# Patient Record
Sex: Female | Born: 1975 | Race: Black or African American | Hispanic: No | Marital: Single | Smoking: Current some day smoker
Health system: Southern US, Community
[De-identification: ages and names within clinical notes are randomized; demographics above are authoritative.]

## PROBLEM LIST (undated history)

## (undated) ENCOUNTER — Emergency Department: Admission: EM | Payer: No Typology Code available for payment source | Source: Home / Self Care

## (undated) DIAGNOSIS — I10 Essential (primary) hypertension: Secondary | ICD-10-CM

---

## 2004-05-20 ENCOUNTER — Emergency Department: Payer: Self-pay | Admitting: Emergency Medicine

## 2004-07-15 ENCOUNTER — Emergency Department: Payer: Self-pay | Admitting: Emergency Medicine

## 2004-09-02 ENCOUNTER — Emergency Department: Payer: Self-pay | Admitting: General Practice

## 2005-04-07 ENCOUNTER — Ambulatory Visit: Payer: Self-pay

## 2005-04-08 ENCOUNTER — Ambulatory Visit: Payer: Self-pay | Admitting: Obstetrics and Gynecology

## 2005-04-11 ENCOUNTER — Ambulatory Visit: Payer: Self-pay

## 2005-06-19 ENCOUNTER — Observation Stay: Payer: Self-pay | Admitting: Obstetrics & Gynecology

## 2005-07-16 ENCOUNTER — Observation Stay: Payer: Self-pay

## 2005-07-26 ENCOUNTER — Observation Stay: Payer: Self-pay | Admitting: Unknown Physician Specialty

## 2005-08-26 ENCOUNTER — Inpatient Hospital Stay: Payer: Self-pay

## 2005-10-08 ENCOUNTER — Emergency Department: Payer: Self-pay | Admitting: Emergency Medicine

## 2005-10-08 ENCOUNTER — Other Ambulatory Visit: Payer: Self-pay

## 2007-02-11 ENCOUNTER — Emergency Department: Payer: Self-pay | Admitting: Emergency Medicine

## 2007-02-11 ENCOUNTER — Other Ambulatory Visit: Payer: Self-pay

## 2007-09-04 ENCOUNTER — Emergency Department: Payer: Self-pay | Admitting: Emergency Medicine

## 2007-10-19 ENCOUNTER — Emergency Department: Payer: Self-pay | Admitting: Unknown Physician Specialty

## 2008-06-01 ENCOUNTER — Ambulatory Visit: Payer: Self-pay

## 2011-05-14 ENCOUNTER — Emergency Department: Payer: Self-pay | Admitting: Internal Medicine

## 2011-09-21 ENCOUNTER — Emergency Department: Payer: Self-pay | Admitting: *Deleted

## 2011-09-21 LAB — COMPREHENSIVE METABOLIC PANEL
Albumin: 4 g/dL (ref 3.4–5.0)
Alkaline Phosphatase: 41 U/L — ABNORMAL LOW (ref 50–136)
BUN: 11 mg/dL (ref 7–18)
Creatinine: 0.7 mg/dL (ref 0.60–1.30)
EGFR (African American): >60
EGFR (Non-African Amer.): >60
Glucose: 99 mg/dL (ref 65–99)
Osmolality: 275 (ref 275–301)
Potassium: 3.8 mmol/L (ref 3.5–5.1)
SGOT(AST): 20 U/L (ref 15–37)
Sodium: 138 mmol/L (ref 136–145)
Total Protein: 7.2 g/dL (ref 6.4–8.2)

## 2011-09-21 LAB — CK TOTAL AND CKMB (NOT AT ARMC): CK-MB: 0.6 ng/mL (ref 0.5–3.6)

## 2011-09-21 LAB — CBC
HGB: 14.2 g/dL (ref 12.0–16.0)
MCV: 94 fL (ref 80–100)
RBC: 4.63 10*6/uL (ref 3.80–5.20)
RDW: 13.1 % (ref 11.5–14.5)

## 2011-09-21 LAB — PREGNANCY, URINE: Pregnancy Test, Urine: NEGATIVE m[IU]/mL

## 2012-06-26 ENCOUNTER — Emergency Department: Payer: Self-pay | Admitting: Emergency Medicine

## 2012-07-11 ENCOUNTER — Emergency Department: Payer: Self-pay | Admitting: Emergency Medicine

## 2012-08-13 ENCOUNTER — Emergency Department: Payer: Self-pay | Admitting: Emergency Medicine

## 2013-04-29 ENCOUNTER — Emergency Department: Payer: Self-pay | Admitting: Emergency Medicine

## 2013-10-22 ENCOUNTER — Emergency Department: Payer: Self-pay | Admitting: Emergency Medicine

## 2013-11-03 ENCOUNTER — Emergency Department: Payer: Self-pay | Admitting: Emergency Medicine

## 2013-11-14 ENCOUNTER — Emergency Department: Payer: Self-pay | Admitting: Emergency Medicine

## 2013-11-15 LAB — HCG, QUANTITATIVE, PREGNANCY: Beta Hcg, Quant.: 876 m[IU]/mL — ABNORMAL HIGH

## 2013-11-15 LAB — CBC
HCT: 40.9 % (ref 35.0–47.0)
HGB: 13.2 g/dL (ref 12.0–16.0)
MCH: 31.2 pg (ref 26.0–34.0)
MCHC: 32.3 g/dL (ref 32.0–36.0)
MCV: 97 fL (ref 80–100)
Platelet: 268 10*3/uL (ref 150–440)
RBC: 4.23 10*6/uL (ref 3.80–5.20)
RDW: 13.6 % (ref 11.5–14.5)
WBC: 11.4 10*3/uL — ABNORMAL HIGH (ref 3.6–11.0)

## 2013-11-17 ENCOUNTER — Ambulatory Visit: Payer: Self-pay | Admitting: Family Medicine

## 2014-10-05 ENCOUNTER — Emergency Department
Admit: 2014-10-05 | Disposition: A | Discharge status: Home / Self Care | Payer: Self-pay | Admitting: Emergency Medicine

## 2014-10-05 LAB — URINALYSIS, COMPLETE
BILIRUBIN, UR: NEGATIVE
Blood: NEGATIVE
GLUCOSE, UR: NEGATIVE mg/dL (ref 0–75)
KETONE: NEGATIVE
Nitrite: NEGATIVE
Ph: 8 (ref 4.5–8.0)
Protein: 30
Specific Gravity: 1.02 (ref 1.003–1.030)

## 2015-03-24 ENCOUNTER — Encounter: Payer: Self-pay | Admitting: Emergency Medicine

## 2015-03-24 ENCOUNTER — Emergency Department: Payer: No Typology Code available for payment source

## 2015-03-24 ENCOUNTER — Emergency Department
Admission: EM | Admit: 2015-03-24 | Discharge: 2015-03-24 | Disposition: A | Discharge status: Home / Self Care | Payer: No Typology Code available for payment source | Attending: Emergency Medicine | Admitting: Emergency Medicine

## 2015-03-24 DIAGNOSIS — Z720 Tobacco use: Secondary | ICD-10-CM | POA: Diagnosis not present

## 2015-03-24 DIAGNOSIS — S3992XA Unspecified injury of lower back, initial encounter: Secondary | ICD-10-CM | POA: Insufficient documentation

## 2015-03-24 DIAGNOSIS — Z79899 Other long term (current) drug therapy: Secondary | ICD-10-CM | POA: Insufficient documentation

## 2015-03-24 DIAGNOSIS — M545 Low back pain, unspecified: Secondary | ICD-10-CM

## 2015-03-24 DIAGNOSIS — Y998 Other external cause status: Secondary | ICD-10-CM | POA: Diagnosis not present

## 2015-03-24 DIAGNOSIS — Y9389 Activity, other specified: Secondary | ICD-10-CM | POA: Insufficient documentation

## 2015-03-24 DIAGNOSIS — Z3202 Encounter for pregnancy test, result negative: Secondary | ICD-10-CM | POA: Insufficient documentation

## 2015-03-24 DIAGNOSIS — I10 Essential (primary) hypertension: Secondary | ICD-10-CM | POA: Insufficient documentation

## 2015-03-24 DIAGNOSIS — Y92410 Unspecified street and highway as the place of occurrence of the external cause: Secondary | ICD-10-CM | POA: Diagnosis not present

## 2015-03-24 HISTORY — DX: Essential (primary) hypertension: I10

## 2015-03-24 LAB — POCT PREGNANCY, URINE: PREG TEST UR: NEGATIVE

## 2015-03-24 MED ORDER — ETODOLAC 400 MG PO TABS: 400.0000 mg | ORAL_TABLET | Freq: Two times a day (BID) | ORAL | Status: DC

## 2015-03-24 MED ORDER — METHOCARBAMOL 500 MG PO TABS
500.0000 mg | ORAL_TABLET | Freq: Four times a day (QID) | ORAL | Status: DC
Start: 2015-03-24 — End: 2021-11-28

## 2015-03-24 MED ORDER — KETOROLAC TROMETHAMINE 60 MG/2ML IM SOLN
60.0000 mg | Freq: Once | INTRAMUSCULAR | Status: AC
  Administered 2015-03-24: 60 mg via INTRAMUSCULAR
  Filled 2015-03-24: qty 2
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
## 2015-06-24 ENCOUNTER — Emergency Department
Admission: EM | Admit: 2015-06-24 | Discharge: 2015-06-24 | Disposition: A | Discharge status: Home / Self Care | Payer: Medicaid Other | Attending: Emergency Medicine | Admitting: Emergency Medicine

## 2015-06-24 ENCOUNTER — Encounter: Payer: Self-pay | Admitting: Emergency Medicine

## 2015-06-24 DIAGNOSIS — F17200 Nicotine dependence, unspecified, uncomplicated: Secondary | ICD-10-CM | POA: Diagnosis not present

## 2015-06-24 DIAGNOSIS — Z791 Long term (current) use of non-steroidal anti-inflammatories (NSAID): Secondary | ICD-10-CM | POA: Insufficient documentation

## 2015-06-24 DIAGNOSIS — Z79899 Other long term (current) drug therapy: Secondary | ICD-10-CM | POA: Diagnosis not present

## 2015-06-24 DIAGNOSIS — Z3202 Encounter for pregnancy test, result negative: Secondary | ICD-10-CM | POA: Insufficient documentation

## 2015-06-24 DIAGNOSIS — E349 Endocrine disorder, unspecified: Secondary | ICD-10-CM

## 2015-06-24 DIAGNOSIS — R7989 Other specified abnormal findings of blood chemistry: Secondary | ICD-10-CM | POA: Diagnosis not present

## 2015-06-24 DIAGNOSIS — I10 Essential (primary) hypertension: Secondary | ICD-10-CM | POA: Insufficient documentation

## 2015-06-24 LAB — URINALYSIS COMPLETE WITH MICROSCOPIC (ARMC ONLY)
BILIRUBIN URINE: NEGATIVE
Bacteria, UA: NONE SEEN
Glucose, UA: NEGATIVE mg/dL
Hgb urine dipstick: NEGATIVE
KETONES UR: NEGATIVE mg/dL
Leukocytes, UA: NEGATIVE
Nitrite: NEGATIVE
Protein, ur: NEGATIVE mg/dL
Specific Gravity, Urine: 1.017 (ref 1.005–1.030)
pH: 6 (ref 5.0–8.0)

## 2015-06-24 LAB — POCT PREGNANCY, URINE: Preg Test, Ur: NEGATIVE

## 2015-06-24 LAB — HCG, QUANTITATIVE, PREGNANCY: hCG, Beta Chain, Quant, S: 112 m[IU]/mL — ABNORMAL HIGH (ref <5)

## 2015-06-24 MED ORDER — LISINOPRIL 20 MG PO TABS
20.0000 mg | ORAL_TABLET | Freq: Once | ORAL | Status: AC
  Administered 2015-06-24: 20 mg via ORAL
  Filled 2015-06-24: qty 1

## 2015-06-24 MED ORDER — HYDROCHLOROTHIAZIDE 25 MG PO TABS
25.0000 mg | ORAL_TABLET | Freq: Every day | ORAL | Status: DC
  Administered 2015-06-24: 25 mg via ORAL
  Filled 2015-06-24: qty 1

## 2015-06-24 NOTE — Discharge Instructions (Signed)
Continue to take your usual blood pressure medication as instructed. We have given you your dose for this evening now. You do not need to take tonight. Follow-up with [facility] for repeat testing of your hCG level. Today, the level was 112. If he continues to go down, there is likely a little else to do. If the hCG level remains the same or goes up, you may need further evaluation and assistance from the gynecologist.  Hypertension Hypertension is another name for high blood pressure. High blood pressure forces your heart to work harder to pump blood. A blood pressure reading has two numbers, which includes a higher number over a lower number (example: 110/72). HOME CARE   Have your blood pressure rechecked by your doctor.  Only take medicine as told by your doctor. Follow the directions carefully. The medicine does not work as well if you skip doses. Skipping doses also puts you at risk for problems.  Do not smoke.  Monitor your blood pressure at home as told by your doctor. GET HELP IF:  You think you are having a reaction to the medicine you are taking.  You have repeat headaches or feel dizzy.  You have puffiness (swelling) in your ankles.  You have trouble with your vision. GET HELP RIGHT AWAY IF:   You get a very bad headache and are confused.  You feel weak, numb, or faint.  You get chest or belly (abdominal) pain.  You throw up (vomit).  You cannot breathe very well. MAKE SURE YOU:   Understand these instructions.  Will watch your condition.  Will get help right away if you are not doing well or get worse.   This information is not intended to replace advice given to you by your health care provider. Make sure you discuss any questions you have with your health care provider.   Document Released: 11/14/2007 Document Revised: 06/02/2013 Document Reviewed: 03/20/2013 Elsevier Interactive Patient Education [employer]2016 [employer] Inc.

## 2015-06-24 NOTE — ED Notes (Signed)
MD at bedside. 

## 2015-06-24 NOTE — ED Notes (Signed)
Pt sent over from [facility] dept for further eval of having a high blood pressure. Pt recently had a pill induced abortion back on 12/23. Had follow up today with [facility] and pt states they did a urine preg today and was positive.

## 2015-06-24 NOTE — ED Provider Notes (Signed)
[facility] Emergency Department Provider Note  ____________________________________________  Time seen: 1628  I have reviewed the triage vital signs and the nursing notes.  History by:  Patient  HISTORY  Chief Complaint Hypertension  question of pregnancy    HPI Karla Jacobs is a 40 y.o. female who recently took medication to terminate a pregnancy. She was approximately [redacted] weeks along when she took this medication that was prescribed by the health department. She did a prednisone test at home and saw that the pregnancy test was positive. She will back to the health department. They repeated the principal test and noted it was positive as well. They're concerned though was for her hypertension. Apparently her blood pressure was approximately 200/100. The patient tells me she tried to tell them that she had not taken her blood pressure medication last night and it is not surprising that her blood pressure was up. She does have a history of hypertension. She denies any chest pain, shortness of breath, headache, or other symptoms.    Past Medical History  Diagnosis Date  . Hypertension     There are no active problems to display for this patient.   History reviewed. No pertinent past surgical history.  Current Outpatient Rx  Name  Route  Sig  Dispense  Refill  . etodolac (LODINE) 400 MG tablet   Oral   Take 1 tablet (400 mg total) by mouth 2 (two) times daily.   20 tablet   0   . hydrochlorothiazide (HYDRODIURIL) 25 MG tablet   Oral   Take 25 mg by mouth daily.         . methocarbamol (ROBAXIN) 500 MG tablet   Oral   Take 1 tablet (500 mg total) by mouth 4 (four) times daily.   20 tablet   0     Allergies Review of patient's allergies indicates no known allergies.  No family history on file.  Social History Social History  Substance Use Topics  . Smoking status: Current Some Day Smoker  . Smokeless tobacco: None  . Alcohol Use:  Yes     Comment: occassional    Review of Systems  Constitutional: Negative for fever/chills. ENT: Negative for congestion. Cardiovascular: Negative for chest pain. Respiratory: Negative for cough. Gastrointestinal: Negative for abdominal pain, vomiting and diarrhea. Genitourinary: Recent use of an abortafactant. Positive home pregnancy test. See history of present illness. Musculoskeletal: No back pain. Skin: Negative for rash. Neurological: Negative for headache or focal weakness   10-point ROS otherwise negative.  ____________________________________________   PHYSICAL EXAM:  VITAL SIGNS: ED Triage Vitals  Enc Vitals Group     BP 06/24/15 1312 168/100 mmHg     Pulse Rate 06/24/15 1312 87     Resp 06/24/15 1312 18     Temp 06/24/15 1312 98.2 F (36.8 C)     Temp Source 06/24/15 1312 Oral     SpO2 06/24/15 1312 100 %     Weight 06/24/15 1312 211 lb (95.709 kg)     Height 06/24/15 1312 5\' 3"  (1.6 m)     Head Cir --      Peak Flow --      Pain Score 06/24/15 1323 7     Pain Loc --      Pain Edu? --      Excl. in GC? --     Constitutional:  Alert and oriented. Well appearing and in no distress. ENT   Head: Normocephalic and atraumatic.  Nose: No congestion/rhinnorhea.  Cardiovascular: Normal rate, regular rhythm, no murmur noted Respiratory:  Normal respiratory effort, no tachypnea.    Breath sounds are clear and equal bilaterally.  Gastrointestinal: Soft, no distention. Nontender Back: No muscle spasm, no tenderness, no CVA tenderness. Musculoskeletal: No deformity noted. Nontender with normal range of motion in all extremities.  No noted edema. Neurologic:  Communicative. Normal appearing spontaneous movement in all 4 extremities. No gross focal neurologic deficits are appreciated.  Skin:  Skin is warm, dry. No rash noted. Psychiatric: Mood and affect are normal. Speech and behavior are normal.  ____________________________________________    LABS  (pertinent positives/negatives)  Labs Reviewed  URINALYSIS COMPLETEWITH MICROSCOPIC ([facility] ONLY) - Abnormal; Notable for the following:    Color, Urine YELLOW (*)    APPearance CLEAR (*)    Squamous Epithelial / LPF 0-5 (*)    All other components within normal limits  HCG, QUANTITATIVE, PREGNANCY - Abnormal; Notable for the following:    hCG, Beta Chain, Quant, S 112 (*)    All other components within normal limits  POC URINE PREG, ED  POCT PREGNANCY, URINE     ____________________________________________  ____________________________________________   INITIAL IMPRESSION / ASSESSMENT AND PLAN / ED COURSE  Pertinent labs & imaging results that were available during my care of the patient were reviewed by me and considered in my medical decision making (see chart for details).  Well-appearing 40 year old female in no acute distress. She had noted hypertension. Her numbers have improved some. She has a history of hypertension. She has been asymptomatic. We will provide her with her usual home medication and discharge her home.  The patient's hCG level is 112. As we do not have a comparison, we do not know how far down from her peak she has come. Having terminated this pregnancy pharmaceutically at 7 weeks of gestation, her hCG a few weeks ago may have been as high as 20 or 30,000. I have explained this to the patient. She has been very appreciative of the information and understands that she simply needs a repeat hCG level. Overall, I suspect the level of 112 to be normal for her situation. She will follow-up with [facility] for further evaluation.  ____________________________________________   FINAL CLINICAL IMPRESSION(S) / ED DIAGNOSES  Final diagnoses:  Essential hypertension  Elevated serum hCG       Darien Ramus, MD 06/24/15 [phone]

## 2015-06-24 NOTE — ED Notes (Signed)
Went to HD, states her b/p is high, has no dizziness or headache, warm blankets given

## 2017-05-10 ENCOUNTER — Ambulatory Visit
Admission: RE | Admit: 2017-05-10 | Discharge: 2017-05-10 | Disposition: A | Discharge status: Home / Self Care | Attending: Family Medicine | Admitting: Family Medicine

## 2017-05-10 NOTE — ED Notes (Signed)
Patient ambulatory to triage with steady gait, without difficulty or distress noted, in custody of [place] [employer] officer Pride and [place] for forensic blood draw; pt A&Ox3, with no c/o voiced and denies need to see ED provider; pt voices good understanding of blood draw to be performed for forensic testing; pt verifies identity with name and DOB; after 2 unsuccessful blood draw attempts by previous triage nurse Lequita Halt, RN, 3rd attempt performed by this nurse using sealed kit provided by officer; tourniquet applied to left upper arm; left antecubital region prepped with betadine swab and allowed to dry completely; needle inserted and 2 grey top blood tubes collected; tourniquet removed, needle removed & intact, dressing applied; tubes labeled, given to officer Pride and placed in sealed container using chain of custody; pt tolerated well and continues to deny c/o or need to see ED provider; pt d/c in police custody

## 2017-06-28 ENCOUNTER — Encounter: Payer: Self-pay | Admitting: *Deleted

## 2017-06-28 ENCOUNTER — Emergency Department
Admission: EM | Admit: 2017-06-28 | Discharge: 2017-06-28 | Disposition: A | Discharge status: Home / Self Care | Attending: Emergency Medicine | Admitting: Emergency Medicine

## 2017-06-28 ENCOUNTER — Other Ambulatory Visit: Payer: Self-pay

## 2017-06-28 DIAGNOSIS — R079 Chest pain, unspecified: Secondary | ICD-10-CM | POA: Insufficient documentation

## 2017-06-28 DIAGNOSIS — I10 Essential (primary) hypertension: Secondary | ICD-10-CM

## 2017-06-28 DIAGNOSIS — F17200 Nicotine dependence, unspecified, uncomplicated: Secondary | ICD-10-CM | POA: Insufficient documentation

## 2017-06-28 LAB — COMPREHENSIVE METABOLIC PANEL
ALBUMIN: 4.5 g/dL (ref 3.5–5.0)
ALK PHOS: 39 U/L (ref 38–126)
ALT: 21 U/L (ref 14–54)
ANION GAP: 10 (ref 5–15)
AST: 22 U/L (ref 15–41)
BILIRUBIN TOTAL: 0.8 mg/dL (ref 0.3–1.2)
BUN: 14 mg/dL (ref 6–20)
CALCIUM: 9.4 mg/dL (ref 8.9–10.3)
CO2: 22 mmol/L (ref 22–32)
Chloride: 104 mmol/L (ref 101–111)
Creatinine, Ser: 0.86 mg/dL (ref 0.44–1.00)
GFR calc Af Amer: >60 mL/min (ref >60)
GFR calc non Af Amer: >60 mL/min (ref >60)
Glucose, Bld: 99 mg/dL (ref 65–99)
Potassium: 4 mmol/L (ref 3.5–5.1)
Sodium: 136 mmol/L (ref 135–145)
TOTAL PROTEIN: 8.1 g/dL (ref 6.5–8.1)

## 2017-06-28 LAB — CBC WITH DIFFERENTIAL/PLATELET
BASOS PCT: 1 %
Basophils Absolute: 0.1 10*3/uL (ref 0–0.1)
Eosinophils Absolute: 0.1 10*3/uL (ref 0–0.7)
Eosinophils Relative: 1 %
HEMATOCRIT: 42.4 % (ref 35.0–47.0)
HEMOGLOBIN: 14.4 g/dL (ref 12.0–16.0)
LYMPHS PCT: 28 %
Lymphs Abs: 2.5 10*3/uL (ref 1.0–3.6)
MCH: 31.6 pg (ref 26.0–34.0)
MCHC: 34.1 g/dL (ref 32.0–36.0)
MCV: 92.7 fL (ref 80.0–100.0)
MONOS PCT: 7 %
Monocytes Absolute: 0.6 10*3/uL (ref 0.2–0.9)
NEUTROS ABS: 5.8 10*3/uL (ref 1.4–6.5)
NEUTROS PCT: 63 %
Platelets: 279 10*3/uL (ref 150–440)
RBC: 4.57 MIL/uL (ref 3.80–5.20)
RDW: 12.9 % (ref 11.5–14.5)
WBC: 9.1 10*3/uL (ref 3.6–11.0)

## 2017-06-28 LAB — TROPONIN I: Troponin I: <0.03 ng/mL (ref <0.03)

## 2017-06-28 MED ORDER — HYDROCHLOROTHIAZIDE 25 MG PO TABS: 25.0000 mg | ORAL_TABLET | Freq: Every day | ORAL | 1 refills | Status: AC

## 2017-06-28 MED ORDER — HYDROCHLOROTHIAZIDE 25 MG PO TABS
ORAL_TABLET | ORAL | Status: AC
  Administered 2017-06-28: 25 mg via ORAL
  Filled 2017-06-28: qty 1

## 2017-06-28 MED ORDER — LOSARTAN POTASSIUM 25 MG PO TABS: 25.0000 mg | ORAL_TABLET | Freq: Every day | ORAL | 11 refills | Status: DC

## 2017-06-28 MED ORDER — OXYCODONE-ACETAMINOPHEN 5-325 MG PO TABS
2.0000 | ORAL_TABLET | Freq: Once | ORAL | Status: AC
  Administered 2017-06-28: 2 via ORAL
  Filled 2017-06-28: qty 2

## 2017-06-28 MED ORDER — HYDROCHLOROTHIAZIDE 25 MG PO TABS
25.0000 mg | ORAL_TABLET | Freq: Every day | ORAL | Status: DC
  Administered 2017-06-28: 25 mg via ORAL
  Filled 2017-06-28: qty 1

## 2017-06-28 NOTE — ED Triage Notes (Signed)
PT to ED reporting 2 days of chest pain and intermittent SOB, dizziness and lightheadedness. Pt also reports feeling as though her blood pressure has been elevated due to "pressure in my eyes" No headache and no neuro symptoms noted. PT has not taken BP medications in 2 weeks.

## 2017-06-28 NOTE — ED Provider Notes (Signed)
[facility] Emergency Department Provider Note       Time seen: ----------------------------------------- 3:49 PM on 06/28/2017 -----------------------------------------   I have reviewed the triage vital signs and the nursing notes.  HISTORY   Chief Complaint Chest Pain and Hypertension    HPI Karla Jacobs is a 42 y.o. female with a history of hypertension who presents to the ED for chest pain and intermittent shortness of breath, dizziness and lightheadedness.  Patient also reports feeling as though her blood pressure has been elevated due to pressure behind her eyes.  She has had intermittent blurry vision.  She has not had a headache or any neurologic symptoms.  She reports she has not taken her blood pressure medications in about 2 weeks.  Previously she had been on them for years.  Patient states even on her HCTZ her blood pressure was still elevated.  Past Medical History:  Diagnosis Date  . Hypertension     There are no active problems to display for this patient.   History reviewed. No pertinent surgical history.  Allergies Patient has no known allergies.  Social History Social History   Tobacco Use  . Smoking status: Current Some Day Smoker  . Smokeless tobacco: Never Used  Substance Use Topics  . Alcohol use: Yes    Comment: occassional  . Drug use: Not on file    Review of Systems Constitutional: Negative for fever. Eyes: Positive for vision changes ENT:  Negative for congestion, sore throat Cardiovascular: Positive for chest pain Respiratory: Negative for shortness of breath. Gastrointestinal: Negative for abdominal pain, vomiting and diarrhea. Musculoskeletal: Negative for back pain. Skin: Negative for rash. Neurological: Negative for headaches, positive for weakness and lightheadedness  All systems negative/normal/unremarkable except as stated in the HPI  ____________________________________________   PHYSICAL  EXAM:  VITAL SIGNS: ED Triage Vitals [06/28/17 1455]  Enc Vitals Group     BP (!) 225/127     Pulse Rate 70     Resp 16     Temp 98.5 F (36.9 C)     Temp Source Oral     SpO2 100 %     Weight 215 lb (97.5 kg)     Height 5\' 3"  (1.6 m)     Head Circumference      Peak Flow      Pain Score 8     Pain Loc      Pain Edu?      Excl. in GC?     Constitutional: Alert and oriented. Well appearing and in no distress. Eyes: Conjunctivae are normal. Normal extraocular movements. ENT   Head: Normocephalic and atraumatic.   Nose: No congestion/rhinnorhea.   Mouth/Throat: Mucous membranes are moist.   Neck: No stridor. Cardiovascular: Normal rate, regular rhythm. No murmurs, rubs, or gallops. Respiratory: Normal respiratory effort without tachypnea nor retractions. Breath sounds are clear and equal bilaterally. No wheezes/rales/rhonchi. Gastrointestinal: Soft and nontender. Normal bowel sounds Musculoskeletal: Nontender with normal range of motion in extremities. No lower extremity tenderness nor edema. Neurologic:  Normal speech and language. No gross focal neurologic deficits are appreciated.  Skin:  Skin is warm, dry and intact. No rash noted. Psychiatric: Mood and affect are normal. Speech and behavior are normal.  ____________________________________________  EKG: Interpreted by me.  Sinus rhythm rate 66 bpm, normal PR interval, normal QRS, normal QT.  ____________________________________________  ED COURSE:  As part of my medical decision making, I reviewed the following data within the electronic MEDICAL RECORD NUMBER  History obtained from family if available, nursing notes, old chart and ekg, as well as notes from prior ED visits. Patient presented for chest pain and hypertension, we will assess with labs and imaging as indicated at this time.   Procedures ____________________________________________   LABS (pertinent positives/negatives)  Labs Reviewed  CBC WITH  DIFFERENTIAL/PLATELET  COMPREHENSIVE METABOLIC PANEL  TROPONIN I   ___________________________________________  DIFFERENTIAL DIAGNOSIS   Unstable angina, hypertensive emergency, hypertensive urgency, medication noncompliance, musculoskeletal pain, anxiety  FINAL ASSESSMENT AND PLAN  Hypertension   Plan: Patient had presented for hypertension and chest pain. Patient's labs were reassuring.  I have restarted her HCTZ and I would likely add an angiotensin receptor blocker as I doubt HCTZ will control her blood pressure alone.  She is stable for outpatient follow-up.   Emily Filbert, MD   Note: This note was generated in part or whole with voice recognition software. Voice recognition is usually quite accurate but there are transcription errors that can and very often do occur. I apologize for any typographical errors that were not detected and corrected.     Emily Filbert, MD 06/28/17 [phone]

## 2019-03-16 ENCOUNTER — Other Ambulatory Visit: Payer: Self-pay

## 2019-03-16 ENCOUNTER — Emergency Department
Admission: EM | Admit: 2019-03-16 | Discharge: 2019-03-17 | Disposition: A | Discharge status: Home / Self Care | Payer: Medicaid Other | Attending: Emergency Medicine | Admitting: Emergency Medicine

## 2019-03-16 ENCOUNTER — Emergency Department: Payer: Medicaid Other

## 2019-03-16 ENCOUNTER — Encounter: Payer: Self-pay | Admitting: Emergency Medicine

## 2019-03-16 DIAGNOSIS — I10 Essential (primary) hypertension: Secondary | ICD-10-CM | POA: Insufficient documentation

## 2019-03-16 DIAGNOSIS — Z5321 Procedure and treatment not carried out due to patient leaving prior to being seen by health care provider: Secondary | ICD-10-CM | POA: Insufficient documentation

## 2019-03-16 LAB — POCT PREGNANCY, URINE: Preg Test, Ur: NEGATIVE

## 2019-03-16 LAB — CBC
HCT: 39.4 % (ref 36.0–46.0)
Hemoglobin: 13.5 g/dL (ref 12.0–15.0)
MCH: 31.8 pg (ref 26.0–34.0)
MCHC: 34.3 g/dL (ref 30.0–36.0)
MCV: 92.9 fL (ref 80.0–100.0)
Platelets: 275 10*3/uL (ref 150–400)
RBC: 4.24 MIL/uL (ref 3.87–5.11)
RDW: 11.7 % (ref 11.5–15.5)
WBC: 9.6 10*3/uL (ref 4.0–10.5)
nRBC: 0 % (ref 0.0–0.2)

## 2019-03-16 LAB — BASIC METABOLIC PANEL
Anion gap: 9 (ref 5–15)
BUN: 10 mg/dL (ref 6–20)
CO2: 27 mmol/L (ref 22–32)
Calcium: 8.8 mg/dL — ABNORMAL LOW (ref 8.9–10.3)
Chloride: 98 mmol/L (ref 98–111)
Creatinine, Ser: 0.81 mg/dL (ref 0.44–1.00)
GFR calc Af Amer: >60 mL/min (ref >60)
GFR calc non Af Amer: >60 mL/min (ref >60)
Glucose, Bld: 101 mg/dL — ABNORMAL HIGH (ref 70–99)
Potassium: 3.4 mmol/L — ABNORMAL LOW (ref 3.5–5.1)
Sodium: 134 mmol/L — ABNORMAL LOW (ref 135–145)

## 2019-03-16 LAB — TROPONIN I (HIGH SENSITIVITY): Troponin I (High Sensitivity): 5 ng/L (ref <18)

## 2019-03-16 MED ORDER — SODIUM CHLORIDE 0.9% FLUSH: 3.0000 mL | Freq: Once | INTRAVENOUS | Status: DC

## 2019-03-16 NOTE — ED Triage Notes (Addendum)
Pt to the for HTN. Pt was seen at her md for covid testing which came back negative but pt was hypertensive. Hx of same. Pt had chest pain earlier and some tightness now and SOB. Pt has not been taking her lisinopril because it makes her feel bad.

## 2019-03-16 NOTE — ED Notes (Signed)
No answer when called from lobby & outside 

## 2019-03-17 ENCOUNTER — Telehealth: Payer: Self-pay | Admitting: Emergency Medicine

## 2019-03-17 NOTE — Telephone Encounter (Signed)
Called patient due to lwot to inquire about condition and follow up plans. Person who answered wants me to call back in 20 min.

## 2019-03-17 NOTE — ED Notes (Signed)
No answer when called several times from lobby & outside 

## 2019-03-17 NOTE — Telephone Encounter (Signed)
Called patient due to lwot to inquire about condition and follow up plans.  She said she did a zoom meet with her doctor today and is going in in 2 weeks.  I told her to make sure her doctor knows about the chest xray and labs and reviews them.  She agrees.

## 2019-04-22 ENCOUNTER — Other Ambulatory Visit: Payer: Self-pay

## 2019-04-22 DIAGNOSIS — Z20822 Contact with and (suspected) exposure to covid-19: Secondary | ICD-10-CM

## 2019-04-22 DIAGNOSIS — Z20828 Contact with and (suspected) exposure to other viral communicable diseases: Secondary | ICD-10-CM

## 2019-04-24 LAB — NOVEL CORONAVIRUS, NAA: SARS-CoV-2, NAA: NOT DETECTED

## 2020-01-21 ENCOUNTER — Ambulatory Visit: Payer: Medicaid Other | Attending: Internal Medicine

## 2020-01-21 DIAGNOSIS — Z23 Encounter for immunization: Secondary | ICD-10-CM

## 2020-01-21 NOTE — Progress Notes (Signed)
   Covid-19 Vaccination Clinic  Name:  RAPHAELLA LARKIN    MRN: [id number] DOB: [birth year]  01/21/2020  Ms. Peaster was observed post Covid-19 immunization for 15 minutes without incident. She was provided with Vaccine Information Sheet and instruction to access the V-Safe system.   Ms. Haberl was instructed to call 911 with any severe reactions post vaccine: Marland Kitchen Difficulty breathing  . Swelling of face and throat  . A fast heartbeat  . A bad rash all over body  . Dizziness and weakness   Immunizations Administered    Name Date Dose VIS Date Route   Moderna COVID-19 Vaccine 01/21/2020  1:20 PM 0.5 mL 05/2019 Intramuscular   Manufacturer: Moderna   Lot: [id number]   NDC: 35361-443-15

## 2020-02-22 ENCOUNTER — Ambulatory Visit: Payer: Medicaid Other | Attending: Internal Medicine

## 2020-02-22 ENCOUNTER — Ambulatory Visit: Payer: Self-pay

## 2020-02-22 DIAGNOSIS — Z23 Encounter for immunization: Secondary | ICD-10-CM

## 2020-02-22 NOTE — Progress Notes (Signed)
   Covid-19 Vaccination Clinic  Name:  Karla Jacobs    MRN: [id number] DOB: June 08, 1976  02/22/2020  [name] was observed post Covid-19 immunization for 15 minutes without incident. She was provided with Vaccine Information Sheet and instruction to access the V-Safe system.   [name] was instructed to call 911 with any severe reactions post vaccine: Marland Kitchen Difficulty breathing  . Swelling of face and throat  . A fast heartbeat  . A bad rash all over body  . Dizziness and weakness   Immunizations Administered    Name Date Dose VIS Date Route   Moderna COVID-19 Vaccine 02/22/2020  9:58 AM 0.5 mL 05/2019 Intramuscular   Manufacturer: Moderna   Lot: [id number]   NDC: 91694-503-88

## 2020-04-05 ENCOUNTER — Encounter: Payer: Self-pay | Admitting: Emergency Medicine

## 2020-04-05 ENCOUNTER — Emergency Department: Payer: Medicaid Other

## 2020-04-05 ENCOUNTER — Emergency Department
Admission: EM | Admit: 2020-04-05 | Discharge: 2020-04-05 | Disposition: A | Discharge status: Home / Self Care | Payer: Medicaid Other | Attending: Emergency Medicine | Admitting: Emergency Medicine

## 2020-04-05 ENCOUNTER — Other Ambulatory Visit: Payer: Self-pay

## 2020-04-05 DIAGNOSIS — I10 Essential (primary) hypertension: Secondary | ICD-10-CM | POA: Diagnosis not present

## 2020-04-05 DIAGNOSIS — R072 Precordial pain: Secondary | ICD-10-CM | POA: Diagnosis present

## 2020-04-05 DIAGNOSIS — F17200 Nicotine dependence, unspecified, uncomplicated: Secondary | ICD-10-CM | POA: Insufficient documentation

## 2020-04-05 DIAGNOSIS — Z79899 Other long term (current) drug therapy: Secondary | ICD-10-CM | POA: Diagnosis not present

## 2020-04-05 DIAGNOSIS — E876 Hypokalemia: Secondary | ICD-10-CM | POA: Diagnosis not present

## 2020-04-05 DIAGNOSIS — I160 Hypertensive urgency: Secondary | ICD-10-CM | POA: Diagnosis not present

## 2020-04-05 DIAGNOSIS — R519 Headache, unspecified: Secondary | ICD-10-CM | POA: Insufficient documentation

## 2020-04-05 DIAGNOSIS — R0602 Shortness of breath: Secondary | ICD-10-CM | POA: Insufficient documentation

## 2020-04-05 DIAGNOSIS — R079 Chest pain, unspecified: Secondary | ICD-10-CM

## 2020-04-05 LAB — CBC
HCT: 43 % (ref 36.0–46.0)
Hemoglobin: 14.8 g/dL (ref 12.0–15.0)
MCH: 31.6 pg (ref 26.0–34.0)
MCHC: 34.4 g/dL (ref 30.0–36.0)
MCV: 91.7 fL (ref 80.0–100.0)
Platelets: 315 10*3/uL (ref 150–400)
RBC: 4.69 MIL/uL (ref 3.87–5.11)
RDW: 12.4 % (ref 11.5–15.5)
WBC: 13.6 10*3/uL — ABNORMAL HIGH (ref 4.0–10.5)
nRBC: 0 % (ref 0.0–0.2)

## 2020-04-05 LAB — BRAIN NATRIURETIC PEPTIDE: B Natriuretic Peptide: 70.9 pg/mL (ref 0.0–100.0)

## 2020-04-05 LAB — BASIC METABOLIC PANEL
Anion gap: 11 (ref 5–15)
BUN: 12 mg/dL (ref 6–20)
CO2: 26 mmol/L (ref 22–32)
Calcium: 9.4 mg/dL (ref 8.9–10.3)
Chloride: 97 mmol/L — ABNORMAL LOW (ref 98–111)
Creatinine, Ser: 0.77 mg/dL (ref 0.44–1.00)
GFR, Estimated: >60 mL/min (ref >60)
Glucose, Bld: 113 mg/dL — ABNORMAL HIGH (ref 70–99)
Potassium: 3.1 mmol/L — ABNORMAL LOW (ref 3.5–5.1)
Sodium: 134 mmol/L — ABNORMAL LOW (ref 135–145)

## 2020-04-05 LAB — TROPONIN I (HIGH SENSITIVITY)
Troponin I (High Sensitivity): 5 ng/L (ref <18)
Troponin I (High Sensitivity): 5 ng/L (ref <18)

## 2020-04-05 LAB — MAGNESIUM: Magnesium: 2.1 mg/dL (ref 1.7–2.4)

## 2020-04-05 LAB — PROCALCITONIN: Procalcitonin: <0.1 ng/mL

## 2020-04-05 LAB — FIBRIN DERIVATIVES D-DIMER (ARMC ONLY): Fibrin derivatives D-dimer (ARMC): 433.48 ng/mL (FEU) (ref 0.00–499.00)

## 2020-04-05 MED ORDER — IBUPROFEN 400 MG PO TABS
400.0000 mg | ORAL_TABLET | Freq: Once | ORAL | Status: AC
  Administered 2020-04-05: 400 mg via ORAL
  Filled 2020-04-05: qty 1

## 2020-04-05 MED ORDER — PROCHLORPERAZINE EDISYLATE 10 MG/2ML IJ SOLN
10.0000 mg | Freq: Once | INTRAMUSCULAR | Status: AC
  Administered 2020-04-05: 10 mg via INTRAVENOUS
  Filled 2020-04-05: qty 2

## 2020-04-05 MED ORDER — ACETAMINOPHEN 500 MG PO TABS
1000.0000 mg | ORAL_TABLET | Freq: Once | ORAL | Status: AC
  Administered 2020-04-05: 1000 mg via ORAL
  Filled 2020-04-05: qty 2

## 2020-04-05 MED ORDER — POTASSIUM CHLORIDE CRYS ER 20 MEQ PO TBCR
40.0000 meq | EXTENDED_RELEASE_TABLET | Freq: Once | ORAL | Status: AC
  Administered 2020-04-05: 40 meq via ORAL
  Filled 2020-04-05: qty 2

## 2020-04-05 MED ORDER — HYDROCHLOROTHIAZIDE 25 MG PO TABS
25.0000 mg | ORAL_TABLET | Freq: Every day | ORAL | Status: DC
  Administered 2020-04-05: 25 mg via ORAL
  Filled 2020-04-05: qty 1

## 2020-04-05 MED ORDER — CLONIDINE HCL 0.1 MG PO TABS: 0.1000 mg | ORAL_TABLET | Freq: Once | ORAL | Status: DC

## 2020-04-05 MED ORDER — LABETALOL HCL 5 MG/ML IV SOLN
10.0000 mg | Freq: Once | INTRAVENOUS | Status: AC
  Administered 2020-04-05: 10 mg via INTRAVENOUS
  Filled 2020-04-05: qty 4

## 2020-04-05 MED ORDER — LOSARTAN POTASSIUM 50 MG PO TABS
25.0000 mg | ORAL_TABLET | Freq: Every day | ORAL | Status: DC
  Administered 2020-04-05: 25 mg via ORAL
  Filled 2020-04-05: qty 1

## 2020-04-05 NOTE — ED Triage Notes (Signed)
Patient to ER for c/o midsternal chest pain since yesterday that worsens with deep breaths.

## 2020-04-05 NOTE — ED Provider Notes (Signed)
[facility] Emergency Department Provider Note  ____________________________________________   First MD Initiated Contact with Patient 04/05/20 1118     (approximate)  I have reviewed the triage vital signs and the nursing notes.   HISTORY  Chief Complaint Chest Pain   HPI Karla Jacobs is a 44 y.o. female with a past medical history of hypertension who presents for assessment of substernal chest pain that began yesterday.  Patient describes as sharp and pressure-like and is worsened by deep breathing.  It is is present at rest and with exertion.  She endorses some mild shortness of breath when she is taking deep breaths.  No prior similar episodes.  No other clear alleviating aggravating factors.  Patient also endorses mild headache and feels like her vision is pulsating.  No fevers, chills, cough, abdominal pain, nausea, vomiting, diarrhea, dysuria, rash, extremity pain, or other acute complaints.  Denies EtOH or illicit drug use.  Denies tobacco abuse.         Past Medical History:  Diagnosis Date  . Hypertension     There are no problems to display for this patient.   History reviewed. No pertinent surgical history.  Prior to Admission medications   Medication Sig Start Date End Date Taking? Authorizing Provider  etodolac (LODINE) 400 MG tablet Take 1 tablet (400 mg total) by mouth 2 (two) times daily. 03/24/15   Tommi Rumps, PA-C  hydrochlorothiazide (HYDRODIURIL) 25 MG tablet Take 25 mg by mouth daily.    [provider]  hydrochlorothiazide (HYDRODIURIL) 25 MG tablet Take 1 tablet (25 mg total) by mouth daily. 06/28/17   Emily Filbert, MD  losartan (COZAAR) 25 MG tablet Take 1 tablet (25 mg total) by mouth daily. 06/28/17 06/28/18  Emily Filbert, MD  methocarbamol (ROBAXIN) 500 MG tablet Take 1 tablet (500 mg total) by mouth 4 (four) times daily. 03/24/15   Tommi Rumps, PA-C    Allergies Patient has no known  allergies.  No family history on file.  Social History Social History   Tobacco Use  . Smoking status: Current Some Day Smoker  . Smokeless tobacco: Never Used  Substance Use Topics  . Alcohol use: Yes    Comment: occassional  . Drug use: Never    Review of Systems  Review of Systems  Constitutional: Negative for chills and fever.  HENT: Negative for sore throat.   Eyes: Negative for pain.  Respiratory: Positive for shortness of breath. Negative for cough and stridor.   Cardiovascular: Positive for chest pain.  Gastrointestinal: Negative for vomiting.  Skin: Negative for rash.  Neurological: Positive for headaches. Negative for seizures and loss of consciousness.  Psychiatric/Behavioral: Negative for suicidal ideas.  All other systems reviewed and are negative.     ____________________________________________   PHYSICAL EXAM:  VITAL SIGNS: ED Triage Vitals  Enc Vitals Group     BP 04/05/20 1003 (!) 194/110     Pulse Rate 04/05/20 1003 72     Resp 04/05/20 1003 20     Temp 04/05/20 1003 98.9 F (37.2 C)     Temp Source 04/05/20 1003 Oral     SpO2 04/05/20 1003 98 %     Weight 04/05/20 0959 223 lb 1.7 oz (101.2 kg)     Height 04/05/20 0959 5\' 3"  (1.6 m)     Head Circumference --      Peak Flow --      Pain Score 04/05/20 0959 10  Pain Loc --      Pain Edu? --      Excl. in GC? --    Vitals:   04/05/20 1250 04/05/20 1334  BP: (!) 198/123 (!) 177/117  Pulse: 66 72  Resp: 20 20  Temp:    SpO2:  98%   Physical Exam Vitals and nursing note reviewed.  Constitutional:      General: She is not in acute distress.    Appearance: She is well-developed.  HENT:     Head: Normocephalic and atraumatic.     Right Ear: External ear normal.     Left Ear: External ear normal.     Nose: Nose normal.  Eyes:     Conjunctiva/sclera: Conjunctivae normal.  Cardiovascular:     Rate and Rhythm: Normal rate and regular rhythm.     Heart sounds: No murmur heard.    Pulmonary:     Effort: Pulmonary effort is normal. No respiratory distress.     Breath sounds: Normal breath sounds.  Abdominal:     Palpations: Abdomen is soft.     Tenderness: There is no abdominal tenderness.  Musculoskeletal:     Cervical back: Neck supple.     Right lower leg: No edema.     Left lower leg: No edema.  Skin:    General: Skin is warm and dry.     Capillary Refill: Capillary refill takes less than 2 seconds.  Neurological:     Mental Status: She is alert and oriented to person, place, and time.  Psychiatric:        Mood and Affect: Mood normal.     PERRLA.  EOMI.  Patient has symmetric strength in all extremities. ____________________________________________   LABS (all labs ordered are listed, but only abnormal results are displayed)  Labs Reviewed  BASIC METABOLIC PANEL - Abnormal; Notable for the following components:      Result Value   Sodium 134 (*)    Potassium 3.1 (*)    Chloride 97 (*)    Glucose, Bld 113 (*)    All other components within normal limits  CBC - Abnormal; Notable for the following components:   WBC 13.6 (*)    All other components within normal limits  BRAIN NATRIURETIC PEPTIDE  MAGNESIUM  FIBRIN DERIVATIVES D-DIMER ([facility] ONLY)  PROCALCITONIN  POC URINE PREG, ED  TROPONIN I (HIGH SENSITIVITY)  TROPONIN I (HIGH SENSITIVITY)   ____________________________________________  EKG  Sinus rhythm with a ventricular to 76, normal axis, unremarkable intervals, no clear evidence of acute ischemia or significant underlying arrhythmia although there is a nonspecific change in lead III. ____________________________________________  RADIOLOGY  ED MD interpretation: No focal consolidation, pneumothorax, large effusion, or widened mediastinum.  Patient has very mild cardiomegaly and some very subtle edema without other acute intrathoracic process.  Official radiology report(s): DG Chest 2 View  Result Date: 04/05/2020 CLINICAL  DATA:  Chest pain EXAM: CHEST - 2 VIEW COMPARISON:  03/16/2019 FINDINGS: Enlargement of cardiac silhouette with pulmonary vascular congestion. Mediastinal contours normal. Lungs clear. No infiltrate, pleural effusion, or pneumothorax. Old healed fracture of the posterolateral LEFT ninth rib. IMPRESSION: Enlargement of cardiac silhouette with pulmonary vascular congestion. No acute abnormalities. Electronically Signed   By: Ulyses Southward M.D.   On: 04/05/2020 10:33    ____________________________________________   PROCEDURES  Procedure(s) performed (including Critical Care):  .1-3 Lead EKG Interpretation Performed by: Gilles Chiquito, MD Authorized by: Gilles Chiquito, MD     Interpretation: normal  ECG rate assessment: normal     Rhythm: sinus rhythm     Ectopy: none     Conduction: normal       ____________________________________________   INITIAL IMPRESSION / ASSESSMENT AND PLAN / ED COURSE        Patient presents for assessment of some chest pain that began yesterday described as pleuritic and associate with some mild shortness of breath.  Patient also complains of some headache.  On arrival patient is hypertensive with a BP of 194/110 with otherwise stable vital signs on room air.  Differential includes but is not limited to chest pain shortness of breath and headache related to hypertensive urgency versus ACS versus PE versus pneumonia versus acute new onset CHF versus symptomatic anemia versus metabolic derangements.  BMP remarkable for mild hyperkalemia with a K of 3.1 otherwise no significant electrolyte metabolic derangements.  CBC with a mild leukocytosis with a WBC count of 13.6 otherwise is unremarkable.  BNP is 70.  Magnesium is 2.1.  Low suspicion for PE as patient's D-dimer is less than 500.  Low suspicion for ACS given reassuring EKG and to nonelevated troponins obtained over 2 hours.  Patient's WBC count is slightly elevated I would low suspicion for  bacterial pneumonia as patient has no fever, cough, or focal consolidations on chest x-ray.  No focal deficits on exam to suggest CVA and I believe patient is meningitic or septic at this time.  Patient was treated with below noted antihypertensives and Compazine and on reassessment stated she felt much improved.  Blood pressure was noted to improve.  While patient does have some congestion noted on her chest x-ray she does not appear volume overloaded on exam and has no cough or elevation of her BNP it is just acute heart failure.  Low suspicion that this is contributing to her chest pain and shortness of breath time.  Discussed at length with the patient that blood pressure is very poorly controlled and that she must follow-up with her PCP in the next 3 to 5 days to have this rechecked and possibly started on new medications or have a dose increase on her current medications.  Also advised patient that why do not believe she is in acute heart failure she did have some slight fluid noted in her lungs which is abnormal that should be followed up in 3 to 5 days by her PCP as well.  Patient voiced understanding and agreement this plan.  Given improvement in symptoms including headache shortness of breath and chest pain as well as improvement in blood pressure and otherwise reassuring work-up patient safe for discharge with plan for outpatient follow-up.  Discharge stable condition.  Strict return precautions advised and discussed.  ____________________________________________   FINAL CLINICAL IMPRESSION(S) / ED DIAGNOSES  Final diagnoses:  Chest pain, unspecified type  Hypertension, unspecified type  Hypokalemia  Hypertensive urgency    Medications  losartan (COZAAR) tablet 25 mg (25 mg Oral Given 04/05/20 1139)  hydrochlorothiazide (HYDRODIURIL) tablet 25 mg (25 mg Oral Given 04/05/20 1334)  potassium chloride SA (KLOR-CON) CR tablet 40 mEq (40 mEq Oral Given 04/05/20 1139)  acetaminophen  (TYLENOL) tablet 1,000 mg (1,000 mg Oral Given 04/05/20 1250)  ibuprofen (ADVIL) tablet 400 mg (400 mg Oral Given 04/05/20 1250)  prochlorperazine (COMPAZINE) injection 10 mg (10 mg Intravenous Given 04/05/20 1303)  labetalol (NORMODYNE) injection 10 mg (10 mg Intravenous Given 04/05/20 1303)     ED Discharge Orders    None  Note:  This document was prepared using Dragon voice recognition software and may include unintentional dictation errors.   Gilles ChiquitoSmith, Yoshua Geisinger P, MD 04/05/20 1426

## 2020-06-13 ENCOUNTER — Other Ambulatory Visit: Payer: Medicaid Other

## 2020-06-13 DIAGNOSIS — Z20822 Contact with and (suspected) exposure to covid-19: Secondary | ICD-10-CM

## 2020-06-14 LAB — SARS-COV-2, NAA 2 DAY TAT

## 2020-06-14 LAB — NOVEL CORONAVIRUS, NAA: SARS-CoV-2, NAA: NOT DETECTED

## 2020-09-19 ENCOUNTER — Other Ambulatory Visit: Payer: Self-pay

## 2020-09-19 ENCOUNTER — Emergency Department
Admission: EM | Admit: 2020-09-19 | Discharge: 2020-09-20 | Disposition: A | Discharge status: Home / Self Care | Payer: No Typology Code available for payment source | Attending: Emergency Medicine | Admitting: Emergency Medicine

## 2020-09-19 ENCOUNTER — Emergency Department: Payer: No Typology Code available for payment source

## 2020-09-19 ENCOUNTER — Encounter: Payer: Self-pay | Admitting: Emergency Medicine

## 2020-09-19 DIAGNOSIS — M25572 Pain in left ankle and joints of left foot: Secondary | ICD-10-CM | POA: Diagnosis not present

## 2020-09-19 DIAGNOSIS — F17200 Nicotine dependence, unspecified, uncomplicated: Secondary | ICD-10-CM | POA: Insufficient documentation

## 2020-09-19 DIAGNOSIS — I10 Essential (primary) hypertension: Secondary | ICD-10-CM | POA: Insufficient documentation

## 2020-09-19 DIAGNOSIS — M25562 Pain in left knee: Secondary | ICD-10-CM | POA: Insufficient documentation

## 2020-09-19 DIAGNOSIS — Y92410 Unspecified street and highway as the place of occurrence of the external cause: Secondary | ICD-10-CM | POA: Diagnosis not present

## 2020-09-19 DIAGNOSIS — Z79899 Other long term (current) drug therapy: Secondary | ICD-10-CM | POA: Insufficient documentation

## 2020-09-19 DIAGNOSIS — M79672 Pain in left foot: Secondary | ICD-10-CM | POA: Insufficient documentation
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
## 2020-09-20 ENCOUNTER — Emergency Department: Payer: No Typology Code available for payment source

## 2020-09-20 MED ORDER — ACETAMINOPHEN 500 MG PO TABS
1000.0000 mg | ORAL_TABLET | Freq: Once | ORAL | Status: AC
  Administered 2020-09-20: 1000 mg via ORAL
  Filled 2020-09-20: qty 2

## 2020-09-20 MED ORDER — NAPROXEN 500 MG PO TABS
500.0000 mg | ORAL_TABLET | Freq: Once | ORAL | Status: AC
  Administered 2020-09-20: 500 mg via ORAL
  Filled 2020-09-20: qty 1
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
## 2020-09-20 NOTE — Discharge Instructions (Signed)
Your blood pressure was noted to be elevated emergency room today.  Please have this rechecked by primary care doctor in the next 2 to 3 days.

## 2020-09-23 ENCOUNTER — Other Ambulatory Visit: Payer: Self-pay

## 2020-09-23 ENCOUNTER — Emergency Department: Payer: No Typology Code available for payment source

## 2020-09-23 ENCOUNTER — Encounter: Payer: Self-pay | Admitting: Emergency Medicine

## 2020-09-23 ENCOUNTER — Emergency Department
Admission: EM | Admit: 2020-09-23 | Discharge: 2020-09-23 | Disposition: A | Discharge status: Home / Self Care | Payer: No Typology Code available for payment source | Attending: Physician Assistant | Admitting: Physician Assistant

## 2020-09-23 DIAGNOSIS — S3992XA Unspecified injury of lower back, initial encounter: Secondary | ICD-10-CM | POA: Diagnosis present

## 2020-09-23 DIAGNOSIS — Z79899 Other long term (current) drug therapy: Secondary | ICD-10-CM | POA: Diagnosis not present

## 2020-09-23 DIAGNOSIS — I10 Essential (primary) hypertension: Secondary | ICD-10-CM | POA: Insufficient documentation

## 2020-09-23 DIAGNOSIS — S39012A Strain of muscle, fascia and tendon of lower back, initial encounter: Secondary | ICD-10-CM | POA: Diagnosis not present

## 2020-09-23 DIAGNOSIS — Y92410 Unspecified street and highway as the place of occurrence of the external cause: Secondary | ICD-10-CM | POA: Diagnosis not present

## 2020-09-23 DIAGNOSIS — F17200 Nicotine dependence, unspecified, uncomplicated: Secondary | ICD-10-CM | POA: Insufficient documentation

## 2020-09-23 LAB — POC URINE PREG, ED: Preg Test, Ur: NEGATIVE

## 2020-09-23 MED ORDER — ORPHENADRINE CITRATE ER 100 MG PO TB12: 100.0000 mg | ORAL_TABLET | Freq: Two times a day (BID) | ORAL | 0 refills | Status: DC

## 2020-09-23 MED ORDER — LIDOCAINE 5 % EX PTCH: 1.0000 | MEDICATED_PATCH | Freq: Two times a day (BID) | CUTANEOUS | 0 refills | Status: AC

## 2020-09-23 MED ORDER — LIDOCAINE 5 % EX PTCH
1.0000 | MEDICATED_PATCH | CUTANEOUS | Status: DC
  Administered 2020-09-23: 1 via TRANSDERMAL
  Filled 2020-09-23: qty 1
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
## 2020-09-23 NOTE — Discharge Instructions (Addendum)
Follow discharge care instructions take medication as directed. °
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
## 2020-09-23 NOTE — ED Triage Notes (Signed)
Pt to ED via POV with c/o lower back pain that started yesterday. Pt ambulatory with steady gait at this time. NAD noted at this time.

## 2020-10-24 ENCOUNTER — Other Ambulatory Visit: Payer: Self-pay | Admitting: Family Medicine

## 2020-10-24 DIAGNOSIS — Z1231 Encounter for screening mammogram for malignant neoplasm of breast: Secondary | ICD-10-CM

## 2020-11-15 ENCOUNTER — Ambulatory Visit
Admission: RE | Admit: 2020-11-15 | Discharge: 2020-11-15 | Disposition: A | Discharge status: Home / Self Care | Payer: Medicaid Other | Source: Ambulatory Visit | Attending: Family Medicine | Admitting: Family Medicine

## 2020-11-15 ENCOUNTER — Other Ambulatory Visit: Payer: Self-pay

## 2020-11-15 DIAGNOSIS — Z1231 Encounter for screening mammogram for malignant neoplasm of breast: Secondary | ICD-10-CM | POA: Insufficient documentation

## 2020-12-17 IMAGING — CR DG CHEST 2V
2 series · 2 of 2 positions shown · non-contrast
Comparison: 03/16/2019

CLINICAL DATA: Chest pain

EXAM:
CHEST - 2 VIEW

[chest pa]
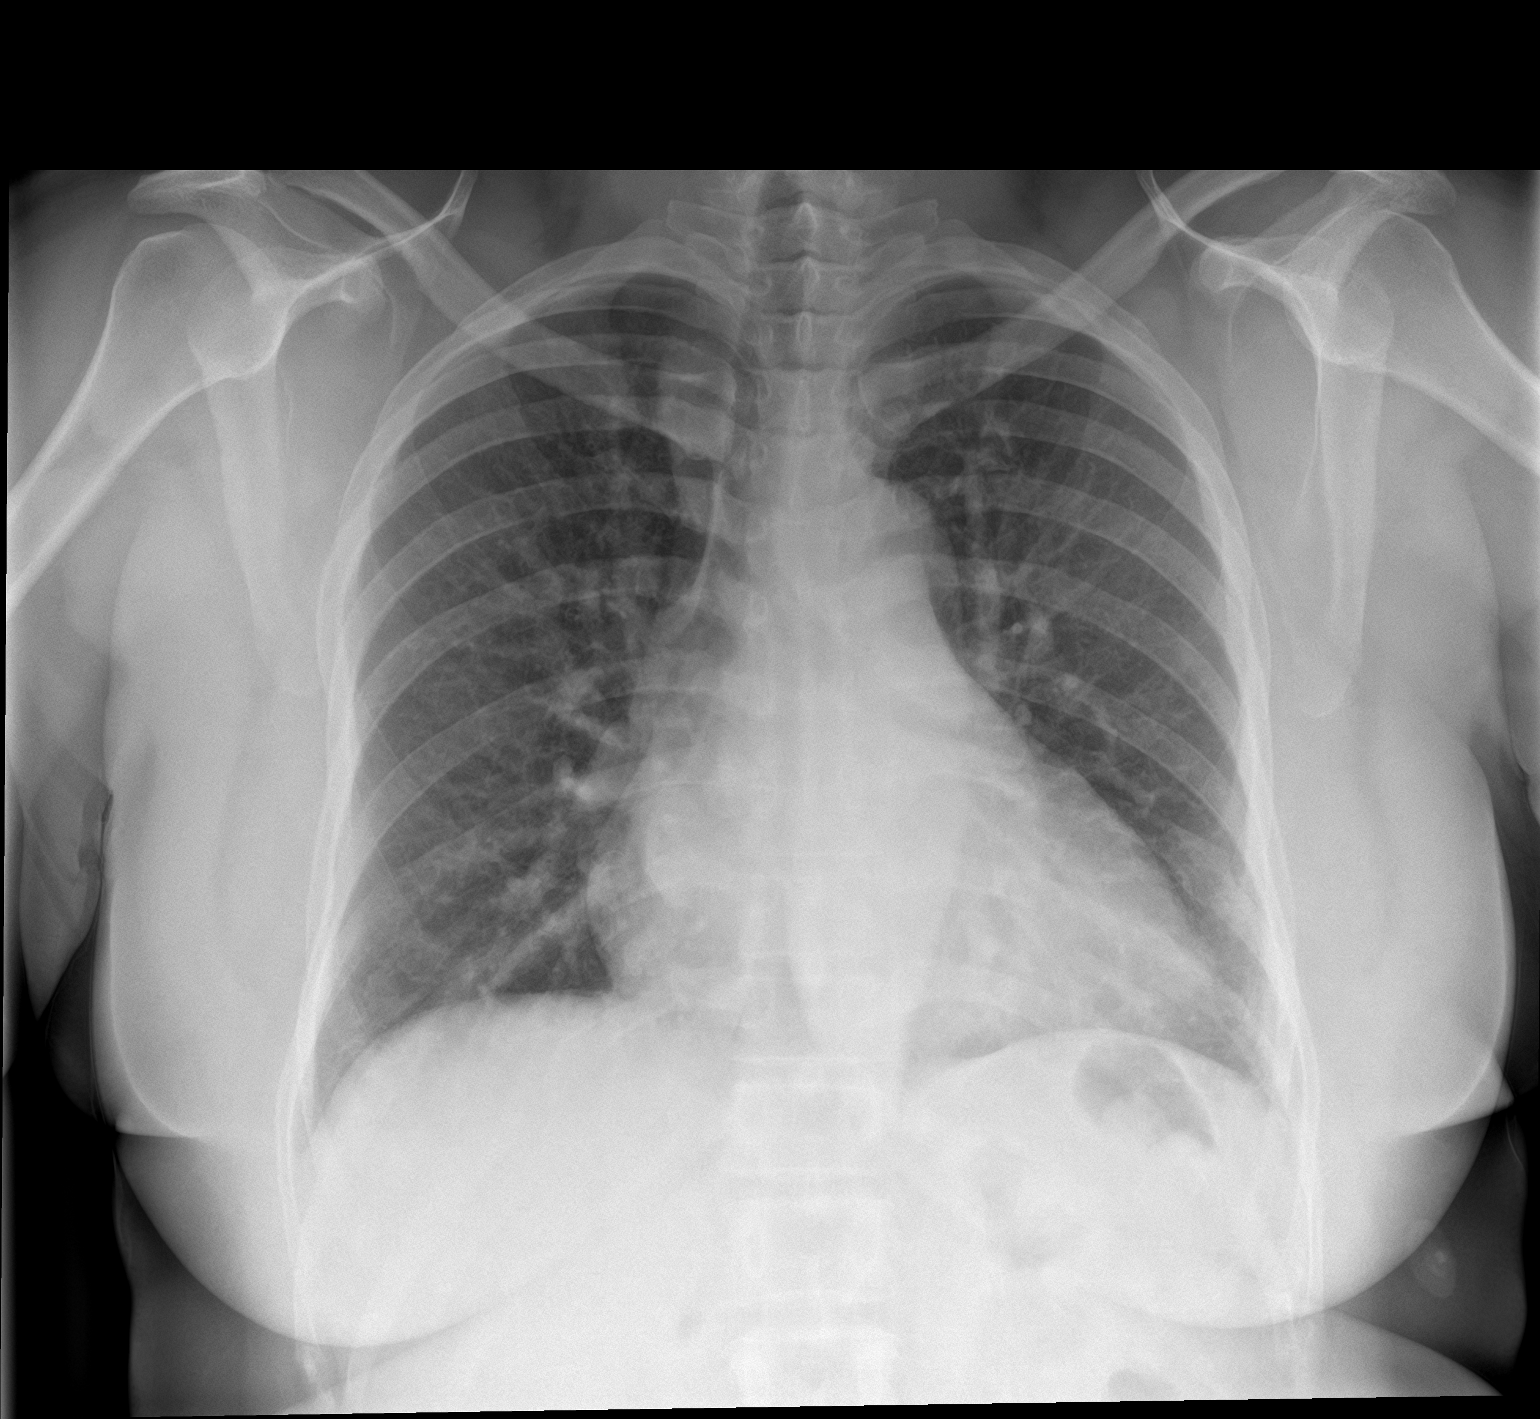

[chest lat]
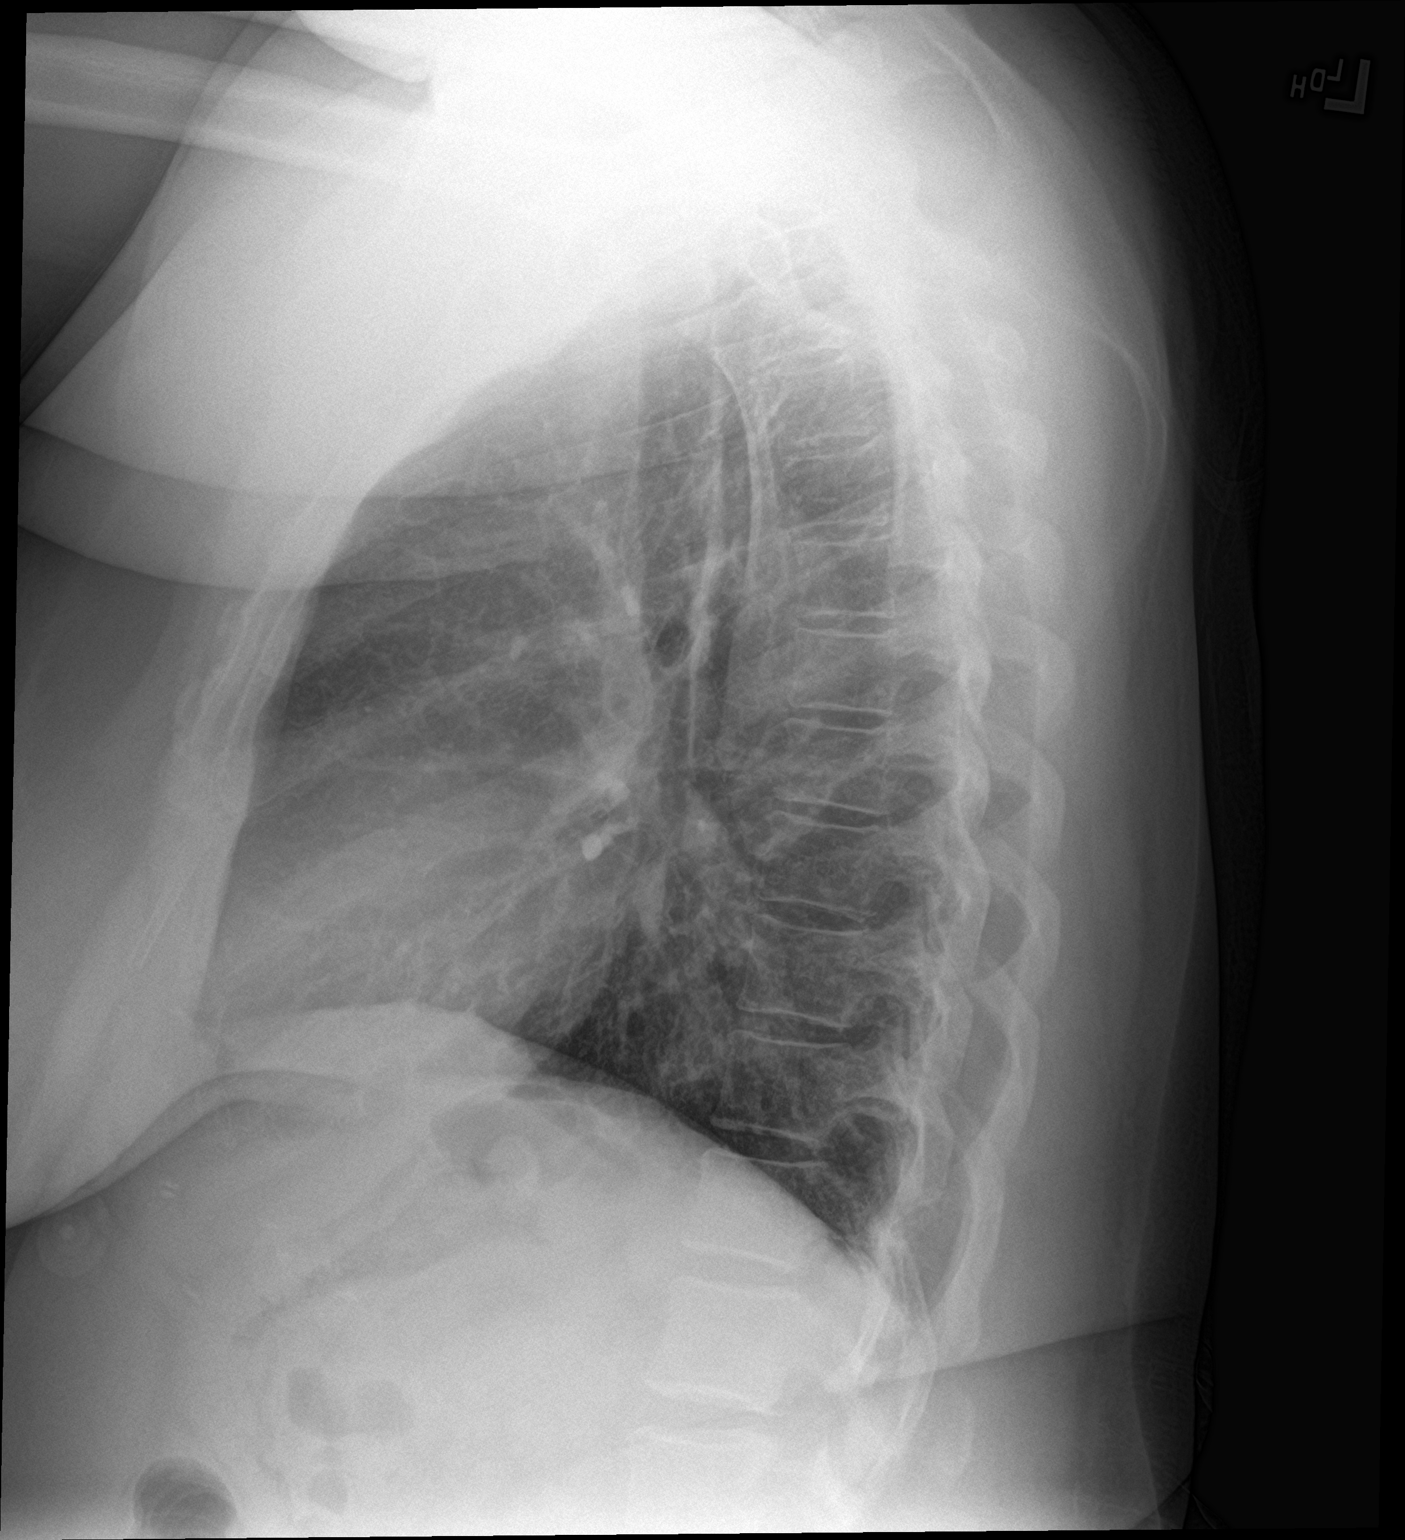

[2 of 2 positions shown; findings below may reference images not displayed]

FINDINGS: Enlargement of cardiac silhouette with pulmonary vascular
congestion.

Mediastinal contours normal.

Lungs clear.

No infiltrate, pleural effusion, or pneumothorax.

Old healed fracture of the posterolateral LEFT ninth rib.
IMPRESSION: Enlargement of cardiac silhouette with pulmonary vascular
congestion.

No acute abnormalities.
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
## 2021-08-15 DIAGNOSIS — J45909 Unspecified asthma, uncomplicated: Secondary | ICD-10-CM | POA: Insufficient documentation

## 2021-08-15 DIAGNOSIS — I10 Essential (primary) hypertension: Secondary | ICD-10-CM | POA: Insufficient documentation

## 2021-11-28 ENCOUNTER — Encounter: Payer: Self-pay | Admitting: Surgery

## 2021-11-28 ENCOUNTER — Other Ambulatory Visit: Payer: Self-pay

## 2021-11-28 ENCOUNTER — Ambulatory Visit (INDEPENDENT_AMBULATORY_CARE_PROVIDER_SITE_OTHER): Payer: Medicaid Other | Admitting: Surgery

## 2021-11-28 VITALS — BP 186/138 | HR 65 | Temp 98.2°F | Ht 63.0 in | Wt 230.0 lb

## 2021-11-28 DIAGNOSIS — Z6841 Body Mass Index (BMI) 40.0 and over, adult: Secondary | ICD-10-CM | POA: Diagnosis not present

## 2021-11-28 DIAGNOSIS — G8929 Other chronic pain: Secondary | ICD-10-CM | POA: Insufficient documentation

## 2021-11-28 DIAGNOSIS — K439 Ventral hernia without obstruction or gangrene: Secondary | ICD-10-CM

## 2021-11-28 DIAGNOSIS — K9049 Malabsorption due to intolerance, not elsewhere classified: Secondary | ICD-10-CM

## 2021-11-28 DIAGNOSIS — R1013 Epigastric pain: Secondary | ICD-10-CM

## 2021-11-28 NOTE — Patient Instructions (Addendum)
Advised to pursue a goal of 25 to 30 g of fiber daily.  Made aware that the majority of this may be through natural sources, but advised to be aware of actual consumption and to ensure minimal consumption by daily supplementation.  Various forms of supplements discussed.  Recommended Psyllium husk, that mixes well with applesauce, or the powder which goes down well shaken with chocolate milk.  Strongly advised to consume more fluids to ensure adequate hydration, instructed to watch color of urine to determine adequacy of hydration.  Clarity is pursued in urine output, and bowel activity that correlates to significant meal intake.   We need to avoid deferring having bowel movements, advised to take the time at the first sign of sensation, typically following meals, and in the morning.   Subsequent utilization of MiraLAX may be needed ensure at least daily movement, ideally twice daily bowel movements.  If multiple doses of MiraLAX are necessary utilize them. Never skip a day...  To be regular, we must do the above EVERY day.        CT Scan scheduled 11/30/21 @ 9:00 am @ [place]. Please pick up prep kit and instructions. Nothing to eat/drink 4 hours prior.  Ultrasound scheduled 12/05/21 @ 7:45 am @ [place]. Enter through the [employer].  Nothing to eat/drink after midnight. Please hold your medications.       Umbilical Hernia, Adult  A hernia is a bulge of tissue that pushes through an opening between muscles. An umbilical hernia happens in the abdomen, near the belly button (umbilicus). The hernia may contain tissues from the small intestine, large intestine, or fatty tissue covering the intestines. Umbilical hernias in adults tend to get worse over time, and they require surgical treatment. There are different types of umbilical hernias, including: Indirect hernia. This type is located just above or below the umbilicus. It is the most common type of umbilical hernia in adults. Direct hernia.  This type forms through an opening formed by the umbilicus. Reducible hernia. This type of hernia comes and goes. It may be visible only when you strain, lift something heavy, or cough. This type of hernia can be pushed back into the abdomen (reduced). Incarcerated hernia. This type traps abdominal tissue inside the hernia. This type of hernia cannot be reduced. Strangulated hernia. This type of hernia cuts off blood flow to the tissues inside the hernia. The tissues can start to die if this happens. This type of hernia requires emergency treatment. What are the causes? An umbilical hernia happens when tissue inside the abdomen presses on a weak area of the abdominal muscles. What increases the risk? You may have a greater risk of this condition if you: Are obese. Have had several pregnancies. Have a buildup of fluid inside your abdomen. Have had surgery that weakens the abdominal muscles. What are the signs or symptoms? The main symptom of this condition is a painless bulge at or near the belly button. A reducible hernia may be visible only when you strain, lift something heavy, or cough. Other symptoms may include: Dull pain. A feeling of pressure. Symptoms of a strangulated hernia may include: Pain that gets increasingly worse. Nausea and vomiting. Pain when pressing on the hernia. Skin over the hernia becoming red or purple. Constipation. Blood in the stool. How is this diagnosed? This condition may be diagnosed based on: A physical exam. You may be asked to cough or strain while standing. These actions increase the pressure inside your abdomen and can  force the hernia through the opening in your muscles. Your health care provider may try to reduce the hernia by pressing on it. Your symptoms and medical history. How is this treated? Surgery is the only treatment for an umbilical hernia. Surgery for a strangulated hernia is done as soon as possible. If you have a small hernia that is  not incarcerated, you may need to lose weight before having surgery. Follow these instructions at home: Lose weight, if told by your health care provider. Do not try to push the hernia back in. Watch your hernia for any changes in color or size. Tell your health care provider if any changes occur. You may need to avoid activities that increase pressure on your hernia. Do not lift anything that is heavier than 10 lb (4.5 kg), or the limit that you are told, until your health care provider says that it is safe. Take over-the-counter and prescription medicines only as told by your health care provider. Keep all follow-up visits. This is important. Contact a health care provider if: Your hernia gets larger. Your hernia becomes painful. Get help right away if: You develop sudden, severe pain near the area of your hernia. You have pain as well as nausea or vomiting. You have pain and the skin over your hernia changes color. You develop a fever or chills. Summary A hernia is a bulge of tissue that pushes through an opening between muscles. An umbilical hernia happens near the belly button. Surgery is the only treatment for an umbilical hernia. Do not try to push your hernia back in. Keep all follow-up visits. This is important. This information is not intended to replace advice given to you by your health care provider. Make sure you discuss any questions you have with your health care provider. Document Revised: 01/04/2020 Document Reviewed: 01/04/2020 Elsevier Patient Education  [place].

## 2021-11-28 NOTE — Progress Notes (Signed)
Patient ID: Karla Jacobs, female   DOB: Mar 20, 1976, 46 y.o.   MRN: [id number]  Chief Complaint: Long history of abdominal wall hernia  History of Present Illness Karla Jacobs is a 46 y.o. female with a bulge in the suprapubic area for 23 years.  It has become painful recently, exacerbated by bending, and by eating greasy or spicy foods.  She reports a significant and progressive recent weight gain.  She denies any prior abdominal surgery. She admits to having times where she was afraid to poop due to her hemorrhoids.  And bearing down/straining or becoming bloated or distended from this is also exacerbated the discomfort.   Past Medical History Past Medical History:  Diagnosis Date   Hypertension       History reviewed. No pertinent surgical history.  No Known Allergies  Current Outpatient Medications  Medication Sig Dispense Refill   hydrochlorothiazide (HYDRODIURIL) 25 MG tablet Take 25 mg by mouth daily.     hydrochlorothiazide (HYDRODIURIL) 25 MG tablet Take 1 tablet (25 mg total) by mouth daily. 30 tablet 1   No current facility-administered medications for this visit.    Family History History reviewed. No pertinent family history.    Social History Social History   Tobacco Use   Smoking status: Some Days   Smokeless tobacco: Never  Substance Use Topics   Alcohol use: Yes    Comment: occassional   Drug use: Never        Review of Systems  Constitutional:  Negative for weight loss.  HENT: Negative.    Eyes: Negative.   Respiratory:  Positive for cough.   Cardiovascular: Negative.   Gastrointestinal:  Positive for abdominal pain and constipation. Negative for vomiting.  Genitourinary: Negative.   Skin: Negative.   Neurological: Negative.   Psychiatric/Behavioral: Negative.        Physical Exam Blood pressure (!) 186/138, pulse 65, temperature 98.2 F (36.8 C), temperature source Oral, height 5\' 3"  (1.6 m), weight 230 lb (104.3 kg), SpO2 97  %. Last Weight  Most recent update: 11/28/2021  1:33 PM    Weight  104.3 kg (230 lb)             CONSTITUTIONAL: Well developed, morbidly obese, and appropriately responsive and aware without distress.   EYES: Sclera non-icteric.   EARS, NOSE, MOUTH AND THROAT:  The oropharynx is clear. Oral mucosa is pink and moist.   Hearing is intact to voice.  NECK: Trachea is midline, and there is no jugular venous distension.  LYMPH NODES:  Lymph nodes in the neck are not enlarged. RESPIRATORY:  Lungs are clear, and breath sounds are equal bilaterally. Normal respiratory effort without pathologic use of accessory muscles. CARDIOVASCULAR: Heart is regular in rate and rhythm. GI: The abdomen is notable for an epigastric diffuse bulge, mild to moderate associated diastases, otherwise soft, nontender, and nondistended. There were no palpable masses. I did not appreciate hepatosplenomegaly. There were normal bowel sounds. MUSCULOSKELETAL:  Symmetrical muscle tone appreciated in all four extremities.    SKIN: Skin turgor is normal. No pathologic skin lesions appreciated.  NEUROLOGIC:  Motor and sensation appear grossly normal.  Cranial nerves are grossly without defect. PSYCH:  Alert and oriented to person, place and time. Affect is appropriate for situation.  Data Reviewed I have personally reviewed what is currently available of the patient's imaging, recent labs and medical records.   Labs:     Latest Ref Rng & Units 04/05/2020   10:05 AM 03/16/2019  7:59 PM 06/28/2017    3:06 PM  CBC  WBC 4.0 - 10.5 K/uL 13.6  9.6  9.1   Hemoglobin 12.0 - 15.0 g/dL 09.3  81.8  29.9   Hematocrit 36.0 - 46.0 % 43.0  39.4  42.4   Platelets 150 - 400 K/uL 315  275  279       Latest Ref Rng & Units 04/05/2020   10:05 AM 03/16/2019    7:59 PM 06/28/2017    3:06 PM  CMP  Glucose 70 - 99 mg/dL 371  696  99   BUN 6 - 20 mg/dL 12  10  14    Creatinine 0.44 - 1.00 mg/dL  7.89  3.81   Sodium 135 - 145 mmol/L  134  134  136   Potassium 3.5 - 5.1 mmol/L 3.1  3.4  4.0   Chloride 98 - 111 mmol/L 97  98  104   CO2 22 - 32 mmol/L 26  27  22    Calcium 8.9 - 10.3 mg/dL 9.4  8.8  9.4   Total Protein 6.5 - 8.1 g/dL   8.1   Total Bilirubin 0.3 - 1.2 mg/dL   0.8   Alkaline Phos 38 - 126 U/L   39   AST 15 - 41 U/L   22   ALT 14 - 54 U/L   21       Imaging:  Within last 24 hrs: No results found.  Assessment    Epigastric pain exacerbated by fatty foods, suspicious for possible cholecystitis. Supraumbilical bulge, consistent with possible epigastric hernia/anterior abdominal wall hernia, difficult to appreciate size of fascial defect. History of constipation with reported hemorrhoids. Patient Active Problem List   Diagnosis Date Noted   Severe obesity (BMI >= 40) (HCC) 11/28/2021   Abdominal pain, chronic, epigastric 11/28/2021   Ventral hernia without obstruction or gangrene 11/28/2021   Asthma 08/15/2021   Hypertension 08/15/2021    Plan    Advised to pursue a goal of 25 to 30 g of fiber daily.  Made aware that the majority of this may be through natural sources, but advised to be aware of actual consumption and to ensure minimal consumption by daily supplementation.  Various forms of supplements discussed.  Recommended Psyllium husk, that mixes well with applesauce, or the powder which goes down well shaken with chocolate milk.  Strongly advised to consume more fluids to ensure adequate hydration, instructed to watch color of urine to determine adequacy of hydration.  Clarity is pursued in urine output, and bowel activity that correlates to significant meal intake.   We need to avoid deferring having bowel movements, advised to take the time at the first sign of sensation, typically following meals, and in the morning.   Subsequent utilization of MiraLAX may be needed ensure at least daily movement, ideally twice daily bowel movements.  If multiple doses of MiraLAX are necessary utilize  them. Never skip a day...  To be regular, we must do the above EVERY day.   We discussed smoking cessation.  We discussed weight loss in preparation for possible hernia surgery. We also discussed needing to rule out common things like chronic calculus cholecystitis in light of her postprandial exacerbations. We will obtain right upper quadrant ultrasound, abdominal CT scan, low-fat diet and follow-up after the above.  Face-to-face time spent with the patient and accompanying care providers(if present) was 45 minutes, with more than 50% of the time spent counseling, educating, and coordinating care of the  patient.    These notes generated with voice recognition software. I apologize for typographical errors.  Campbell Lerner M.D., FACS 11/28/2021, 1:55 PM

## 2021-12-05 ENCOUNTER — Ambulatory Visit
Admission: RE | Admit: 2021-12-05 | Discharge: 2021-12-05 | Disposition: A | Discharge status: Home / Self Care | Payer: Medicaid Other | Source: Ambulatory Visit | Attending: Surgery | Admitting: Surgery

## 2021-12-05 DIAGNOSIS — G8929 Other chronic pain: Secondary | ICD-10-CM | POA: Insufficient documentation

## 2021-12-05 DIAGNOSIS — R1013 Epigastric pain: Secondary | ICD-10-CM | POA: Insufficient documentation

## 2021-12-06 ENCOUNTER — Telehealth: Payer: Self-pay | Admitting: *Deleted

## 2021-12-06 NOTE — Telephone Encounter (Signed)
Left patient a message letting her know that her CT scan for tomorrow 12/07/21 has still not been approved- its in physician review, and she needs to get it r/s.

## 2021-12-07 ENCOUNTER — Other Ambulatory Visit: Payer: Medicaid Other

## 2021-12-07 ENCOUNTER — Ambulatory Visit: Admission: RE | Admit: 2021-12-07 | Payer: Medicaid Other | Source: Ambulatory Visit

## 2021-12-13 ENCOUNTER — Telehealth: Payer: Self-pay

## 2021-12-13 NOTE — Telephone Encounter (Signed)
Spoke with the patient and let her know that we still do not have authorization from her insurance company for her scheduled CT for tomorrow. We will cancel her CT scan and follow up appointment. We will get these appointments rescheduled once we obtain authorization from insurance. The patient is aware.

## 2021-12-14 ENCOUNTER — Ambulatory Visit: Admission: RE | Admit: 2021-12-14 | Payer: Medicaid Other | Source: Ambulatory Visit

## 2021-12-19 ENCOUNTER — Ambulatory Visit: Payer: Medicaid Other | Admitting: Surgery

## 2022-02-16 ENCOUNTER — Telehealth: Payer: Self-pay

## 2022-02-16 NOTE — Telephone Encounter (Signed)
CT Abdomen /Pelvis scheduled 02/23/2022 @ 8 am @ [place]. Please pick up prep kit today. Nothing to eat/drink to eat 4 hours prior.  Patient notified of appointment as well as follow up with Dr.Rodenberg.

## 2022-02-23 ENCOUNTER — Ambulatory Visit
Admission: RE | Admit: 2022-02-23 | Discharge: 2022-02-23 | Disposition: A | Discharge status: Home / Self Care | Payer: Medicaid Other | Source: Ambulatory Visit | Attending: Surgery | Admitting: Surgery

## 2022-02-23 DIAGNOSIS — R1013 Epigastric pain: Secondary | ICD-10-CM | POA: Diagnosis present

## 2022-02-23 DIAGNOSIS — G8929 Other chronic pain: Secondary | ICD-10-CM | POA: Diagnosis present

## 2022-02-23 MED ORDER — IOHEXOL 300 MG/ML  SOLN
100.0000 mL | Freq: Once | INTRAMUSCULAR | Status: AC | PRN
  Administered 2022-02-23: 100 mL via INTRAVENOUS

## 2022-02-27 ENCOUNTER — Ambulatory Visit (INDEPENDENT_AMBULATORY_CARE_PROVIDER_SITE_OTHER): Payer: Medicaid Other | Admitting: Surgery

## 2022-02-27 ENCOUNTER — Encounter: Payer: Self-pay | Admitting: Surgery

## 2022-02-27 VITALS — BP 163/102 | HR 71 | Temp 98.3°F | Ht 63.0 in | Wt 228.0 lb

## 2022-02-27 DIAGNOSIS — K439 Ventral hernia without obstruction or gangrene: Secondary | ICD-10-CM | POA: Diagnosis not present

## 2022-02-27 DIAGNOSIS — R1013 Epigastric pain: Secondary | ICD-10-CM | POA: Diagnosis not present

## 2022-02-27 DIAGNOSIS — G8929 Other chronic pain: Secondary | ICD-10-CM | POA: Diagnosis not present

## 2022-02-27 DIAGNOSIS — Z6841 Body Mass Index (BMI) 40.0 and over, adult: Secondary | ICD-10-CM

## 2022-02-27 NOTE — Progress Notes (Signed)
Surgical Clinic Progress/Follow-up Note   HPI:  46 y.o. Female presents to clinic for ventral hernia and morbid obesity follow-up.    3 months follow the last evaluation. Patient reports no significant improvement/resolution of prior issues and has been limiting her portions in an attempt to pursue weight loss, denies N/V, fever/chills, CP, or SOB.  Review of Systems:  Constitutional: denies fever/chills  ENT: denies sore throat, hearing problems  Respiratory: denies shortness of breath, wheezing  Cardiovascular: denies chest pain, palpitations  Gastrointestinal: denies N/V, or diarrhea/and bowel function as per interval history Skin: Denies any other rashes or skin discolorations as per interval history  Vital Signs:  BP (!) 163/102   Pulse 71   Temp 98.3 F (36.8 C)   Ht 5\' 3"  (1.6 m)   Wt 228 lb (103.4 kg)   LMP 02/20/2022 (Exact Date)   SpO2 98%   BMI 40.39 kg/m    Physical Exam:  Constitutional:  -- Obese body habitus  -- Awake, alert, and oriented x3  Pulmonary:  -- No crackles -- Equal breath sounds bilaterally -- Breathing non-labored at rest Cardiovascular:  -- S1, S2 present  -- No pericardial rubs  Gastrointestinal:  -- Soft and non-distended, minimal tenderness associated with a diffuse epigastric bulge with associated diastases recti.  Otherwise nontender.-- No abdominal masses appreciated, pulsatile or otherwise    Musculoskeletal / Integumentary:  -- Wounds or skin discoloration: None appreciated  -- Extremities: B/L UE and LE FROM, hands and feet warm, no edema   Laboratory studies: Radiology review:   CLINICAL DATA:  abdominal pain, Abdominal pain, chronic, epigastric   EXAM: CT ABDOMEN AND PELVIS WITH CONTRAST   TECHNIQUE: Multidetector CT imaging of the abdomen and pelvis was performed using the standard protocol following bolus administration of intravenous contrast.   RADIATION DOSE REDUCTION: This exam was performed according to  the departmental dose-optimization program which includes automated exposure control, adjustment of the mA and/or kV according to patient size and/or use of iterative reconstruction technique.   CONTRAST:  128mL OMNIPAQUE IOHEXOL 300 MG/ML  SOLN   COMPARISON:  US abdomen 12/05/2021   FINDINGS: Lower chest: No acute abnormality.   Hepatobiliary: No focal liver abnormality. No gallstones, gallbladder wall thickening, or pericholecystic fluid. No biliary dilatation.   Pancreas: No focal lesion. Normal pancreatic contour. No surrounding inflammatory changes. No main pancreatic ductal dilatation.   Spleen: Normal in size without focal abnormality.   Adrenals/Urinary Tract:   No adrenal nodule bilaterally.   Bilateral kidneys enhance symmetrically.   No hydronephrosis. No hydroureter.   The urinary bladder is unremarkable.   Stomach/Bowel: Stomach is within normal limits. No evidence of bowel wall thickening or dilatation. Appendix appears normal. Appendicoliths noted within the appendiceal tip.   Vascular/Lymphatic: No abdominal aorta or iliac aneurysm. No abdominal, pelvic, or inguinal lymphadenopathy.   Reproductive: Uterus and bilateral adnexa are unremarkable.   Other: No intraperitoneal free fluid. No intraperitoneal free gas. No organized fluid collection.   Musculoskeletal:   Small omental fat containing supraumbilical ventral wall hernia with associated slight fat stranding. Abdominal defect of 1.6 x 1.7 cm.   No suspicious lytic or blastic osseous lesions. No acute displaced fracture.   IMPRESSION: Small omental fat containing supraumbilical ventral wall hernia with associated slight fat stranding. Recommend physical exam correlation for incarceration. No associated bowel obstruction.     Electronically Signed   By: Iven Finn M.D.   On: 02/24/2022 18:28  Imaging:    Assessment:  46 y.o.  yo Female with a problem list including...  Patient  Active Problem List   Diagnosis Date Noted   Severe obesity (BMI >= 40) ([place]) 11/28/2021   Abdominal pain, chronic, epigastric 11/28/2021   Ventral hernia without obstruction or gangrene 11/28/2021   Asthma 08/15/2021   Hypertension 08/15/2021    presents to clinic for follow-up evaluation of progress in weight loss, she remains morbidly obese otherwise, without remarkable change.  Plan:              - return to clinic 3 months or as needed, instructed to call office if any questions or concerns  Once again we reviewed weight loss, emphasizing principles of portion control, along with increased activity.  We also discussed options of bariatric surgery for which she does not appear to have an interest in at this time. I encouraged her that I want to help her and repair this hernia, and we will continue to follow her until we can accomplish this.  We remain readily available.  All of the above recommendations were discussed with the patient, and all of patient's questions were answered to her expressed satisfaction.  These notes generated with voice recognition software. I apologize for typographical errors.  Ronny Bacon, MD, FACS [place]: [facility]. Office: [phone]

## 2022-02-27 NOTE — Patient Instructions (Addendum)

## 2022-05-29 ENCOUNTER — Ambulatory Visit: Payer: Medicaid Other | Admitting: Surgery

## 2023-02-19 ENCOUNTER — Other Ambulatory Visit: Payer: Self-pay

## 2023-02-19 ENCOUNTER — Emergency Department: Payer: Medicaid Other

## 2023-02-19 ENCOUNTER — Emergency Department
Admission: EM | Admit: 2023-02-19 | Discharge: 2023-02-19 | Disposition: A | Discharge status: Home / Self Care | Payer: Medicaid Other | Attending: Emergency Medicine | Admitting: Emergency Medicine

## 2023-02-19 ENCOUNTER — Encounter: Payer: Self-pay | Admitting: Emergency Medicine

## 2023-02-19 DIAGNOSIS — I10 Essential (primary) hypertension: Secondary | ICD-10-CM | POA: Insufficient documentation

## 2023-02-19 DIAGNOSIS — M79641 Pain in right hand: Secondary | ICD-10-CM | POA: Insufficient documentation

## 2023-02-19 NOTE — ED Provider Notes (Signed)
[facility] Provider Note    None    (approximate)   History   Hand Pain   HPI  Karla Jacobs is a 47 y.o. female with PMH of hypertension presents for evaluation of right hand pain and swelling.  Patient states she has had pain for a while but the swelling began recently.  She denies any injury to her hand.  She states she has a history of carpal tunnel and feels her symptoms are related to this.  She has tingling in the tips of her middle and ring finger.  She denies any weakness in the hand.  She works as a Firefighter.     Physical Exam   Triage Vital Signs: ED Triage Vitals  Encounter Vitals Group     BP 02/19/23 0532 (!) 145/88     Systolic BP Percentile --      Diastolic BP Percentile --      Pulse Rate 02/19/23 0532 64     Resp 02/19/23 0532 20     Temp 02/19/23 0532 98.1 F (36.7 C)     Temp src --      SpO2 02/19/23 0532 98 %     Weight 02/19/23 0531 225 lb (102.1 kg)     Height 02/19/23 0531 5\' 3"  (1.6 m)     Head Circumference --      Peak Flow --      Pain Score 02/19/23 0544 10     Pain Loc --      Pain Education --      Exclude from Growth Chart --     Most recent vital signs: Vitals:   02/19/23 0532  BP: (!) 145/88  Pulse: 64  Resp: 20  Temp: 98.1 F (36.7 C)  SpO2: 98%     General: Awake, no distress.  CV:  Good peripheral perfusion.  Resp:  Normal effort.  Abd:  No distention.  Other:  Right hand is slightly swollen when compared to the left.  Tenderness to palpation over the third metacarpal head, negative Tinel's sign, Phalen's test exacerbates symptoms, compression of the carpal tunnel alleviate symptoms, numbness and tingling to the tip of the middle and ring finger, no atrophy of the thenar eminence, 5/5 strength when compared to the left hand.  Radial pulse 2+ and regular.   ED Results / Procedures / Treatments   Labs (all labs ordered are listed, but only abnormal results are displayed) Labs Reviewed - No  data to display   RADIOLOGY  Right hand x-rays obtained, interpreted the images as well as reviewed the radiologist report which is negative for fracture or dislocation but does show some soft tissue swelling.    PROCEDURES:  Critical Care performed: No  Procedures   MEDICATIONS ORDERED IN ED: Medications - No data to display   IMPRESSION / MDM / ASSESSMENT AND PLAN / ED COURSE  I reviewed the triage vital signs and the nursing notes.                             47 year old female presents for evaluation of right hand pain and swelling.  Patient was hypertensive in triage but does have a history of hypertension.  Vital signs were stable otherwise.  Patient NAD on exam.  Differential diagnosis includes, but is not limited to, fracture, carpal tunnel syndrome, pronator teres syndrome, ulnar nerve syndrome, wrist sprain.  Patient's presentation is most consistent with  acute complicated illness / injury requiring diagnostic workup.  Right hand x-rays obtained, interpreted the images as well as reviewed the radiologist report which is negative for fracture and dislocation but did show some soft tissue swelling.  I believe patient's diagnosis is most consistent with carpal tunnel syndrome.  I will advise patient on over-the-counter pain management using Tylenol and ibuprofen.  Patient was offered a wrist brace but she states she has one at home and this does not help.  Patient requested an intra-articular cortisone shot or an MRI, I explained that this is not something we would do at the ER but I will refer her to hand surgery.  She is adamant that she sees someone today, so I advised her to try going to the [facility] walk-in clinic.  Patient was agreeable to plan, voiced understanding and was stable at discharge.      FINAL CLINICAL IMPRESSION(S) / ED DIAGNOSES   Final diagnoses:  Right hand pain     Rx / DC Orders   ED Discharge Orders     None        Note:  This  document was prepared using Dragon voice recognition software and may include unintentional dictation errors.   Cameron Ali, PA-C 02/19/23 0800    Pilar Jarvis, MD 02/19/23 1016

## 2023-02-19 NOTE — ED Triage Notes (Signed)
Pt presents to ER with c/o right hand pain and swelling that started this week.  Pt denies any recent injury to hand, bit has hx of carpal tunnel.  Pt does have some increased swelling to right hand, and has some tingling in the tips of her fingers.  Pt is otherwise A&O x4 and in NAD at this time.

## 2023-02-19 NOTE — Discharge Instructions (Addendum)
You can take 650 mg of Tylenol and 600 mg of ibuprofen every 6 hours as needed for pain.  Please read the attached information about carpal tunnel and how to prevent it.  Please wear your wrist brace while you are at work and at night.  Call Dr. Bryson Ha office to schedule a follow-up appointment.  If you need to see someone sooner, [facility] is open from 8am-9pm today.

## 2024-03-16 ENCOUNTER — Ambulatory Visit: Admitting: Family Medicine
